# Patient Record
Sex: Female | Born: 1975 | Race: White | Hispanic: No | Marital: Married | Smoking: Never smoker
Health system: Southern US, Community
[De-identification: ages and names within clinical notes are randomized; demographics above are authoritative.]

## PROBLEM LIST (undated history)

## (undated) HISTORY — PX: ABDOMINAL HYSTERECTOMY: SHX81

## (undated) HISTORY — PX: ANKLE SURGERY: SHX546

## (undated) HISTORY — PX: WRIST SURGERY: SHX841

---

## 2017-12-27 ENCOUNTER — Encounter (HOSPITAL_COMMUNITY): Payer: Self-pay

## 2017-12-27 ENCOUNTER — Other Ambulatory Visit: Payer: Self-pay

## 2017-12-27 ENCOUNTER — Emergency Department (HOSPITAL_COMMUNITY): Payer: BLUE CROSS/BLUE SHIELD

## 2017-12-27 ENCOUNTER — Emergency Department (HOSPITAL_COMMUNITY)
Admission: EM | Admit: 2017-12-27 | Discharge: 2017-12-27 | Disposition: A | Discharge status: Home / Self Care | Payer: BLUE CROSS/BLUE SHIELD | Attending: Emergency Medicine | Admitting: Emergency Medicine

## 2017-12-27 DIAGNOSIS — S5012XA Contusion of left forearm, initial encounter: Secondary | ICD-10-CM | POA: Diagnosis not present

## 2017-12-27 DIAGNOSIS — Y999 Unspecified external cause status: Secondary | ICD-10-CM | POA: Insufficient documentation

## 2017-12-27 DIAGNOSIS — S60222A Contusion of left hand, initial encounter: Secondary | ICD-10-CM | POA: Diagnosis not present

## 2017-12-27 DIAGNOSIS — Y9389 Activity, other specified: Secondary | ICD-10-CM | POA: Insufficient documentation

## 2017-12-27 DIAGNOSIS — S6992XA Unspecified injury of left wrist, hand and finger(s), initial encounter: Secondary | ICD-10-CM | POA: Diagnosis present

## 2017-12-27 DIAGNOSIS — Y929 Unspecified place or not applicable: Secondary | ICD-10-CM | POA: Insufficient documentation

## 2017-12-27 MED ORDER — IBUPROFEN 800 MG PO TABS
800.0000 mg | ORAL_TABLET | Freq: Once | ORAL | Status: AC
  Administered 2017-12-27: 800 mg via ORAL
  Filled 2017-12-27: qty 1

## 2017-12-27 MED ORDER — IBUPROFEN 600 MG PO TABS: 600.0000 mg | ORAL_TABLET | Freq: Four times a day (QID) | ORAL | 0 refills | Status: AC | PRN

## 2017-12-27 MED ORDER — BACITRACIN-NEOMYCIN-POLYMYXIN 400-5-5000 EX OINT
TOPICAL_OINTMENT | Freq: Once | CUTANEOUS | Status: DC
  Filled 2017-12-27: qty 1
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
